# Patient Record
Sex: Male | Born: 1990 | Race: Black or African American | Hispanic: No | Marital: Single | Smoking: Current every day smoker
Health system: Southern US, Community
[De-identification: ages and names within clinical notes are randomized; demographics above are authoritative.]

---

## 2013-08-08 ENCOUNTER — Emergency Department: Payer: Self-pay | Admitting: Internal Medicine

## 2013-08-08 LAB — CBC
HCT: 49.6 % (ref 40.0–52.0)
HGB: 15.9 g/dL (ref 13.0–18.0)
MCH: 26.5 pg (ref 26.0–34.0)
MCHC: 32 g/dL (ref 32.0–36.0)
MCV: 83 fL (ref 80–100)
Platelet: 142 10*3/uL — ABNORMAL LOW (ref 150–440)
RBC: 6 10*6/uL — ABNORMAL HIGH (ref 4.40–5.90)
RDW: 14.3 % (ref 11.5–14.5)
WBC: 4.6 10*3/uL (ref 3.8–10.6)

## 2013-08-08 LAB — BASIC METABOLIC PANEL
ANION GAP: 4 — AB (ref 7–16)
BUN: 8 mg/dL (ref 7–18)
CALCIUM: 9.1 mg/dL (ref 8.5–10.1)
CHLORIDE: 106 mmol/L (ref 98–107)
CREATININE: 1.1 mg/dL (ref 0.60–1.30)
Co2: 29 mmol/L (ref 21–32)
EGFR (African American): >60
EGFR (Non-African Amer.): >60
Glucose: 83 mg/dL (ref 65–99)
OSMOLALITY: 275 (ref 275–301)
POTASSIUM: 3.8 mmol/L (ref 3.5–5.1)
SODIUM: 139 mmol/L (ref 136–145)

## 2013-08-08 LAB — TROPONIN I: Troponin-I: <0.02 ng/mL

## 2014-01-08 ENCOUNTER — Emergency Department: Payer: Self-pay | Admitting: Internal Medicine

## 2014-01-12 ENCOUNTER — Emergency Department: Payer: Self-pay | Admitting: Emergency Medicine

## 2015-12-18 IMAGING — CR DG CHEST 2V
1 series · 2 of 2 positions shown · non-contrast
Comparison: None.

CLINICAL DATA: Chest pain.  Shortness of breath.

EXAM:
CHEST  2 VIEW

[Series 1: w chest pa · 0.14mm/px · 2 of 2 slices shown]
[im 1/2]
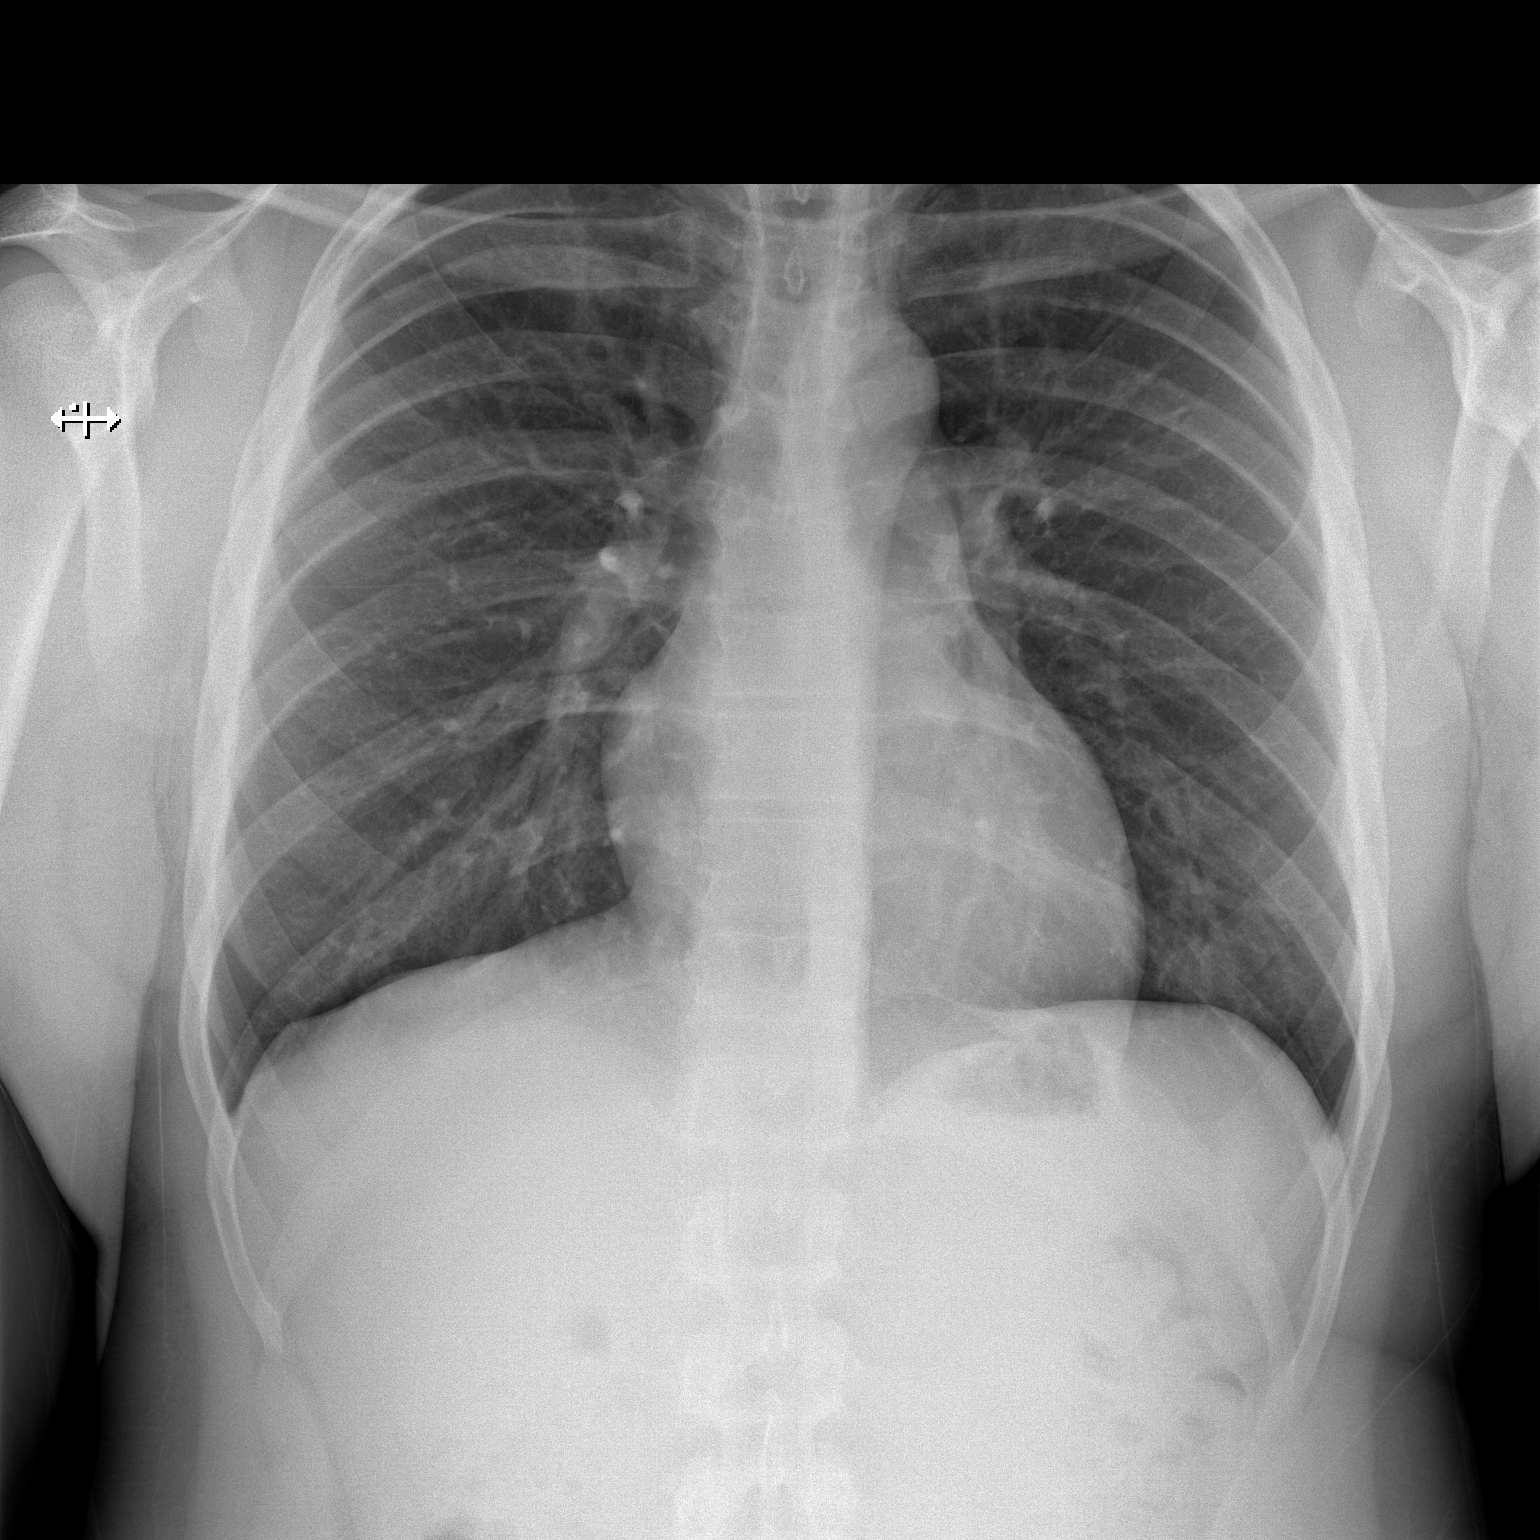
[im 2/2]
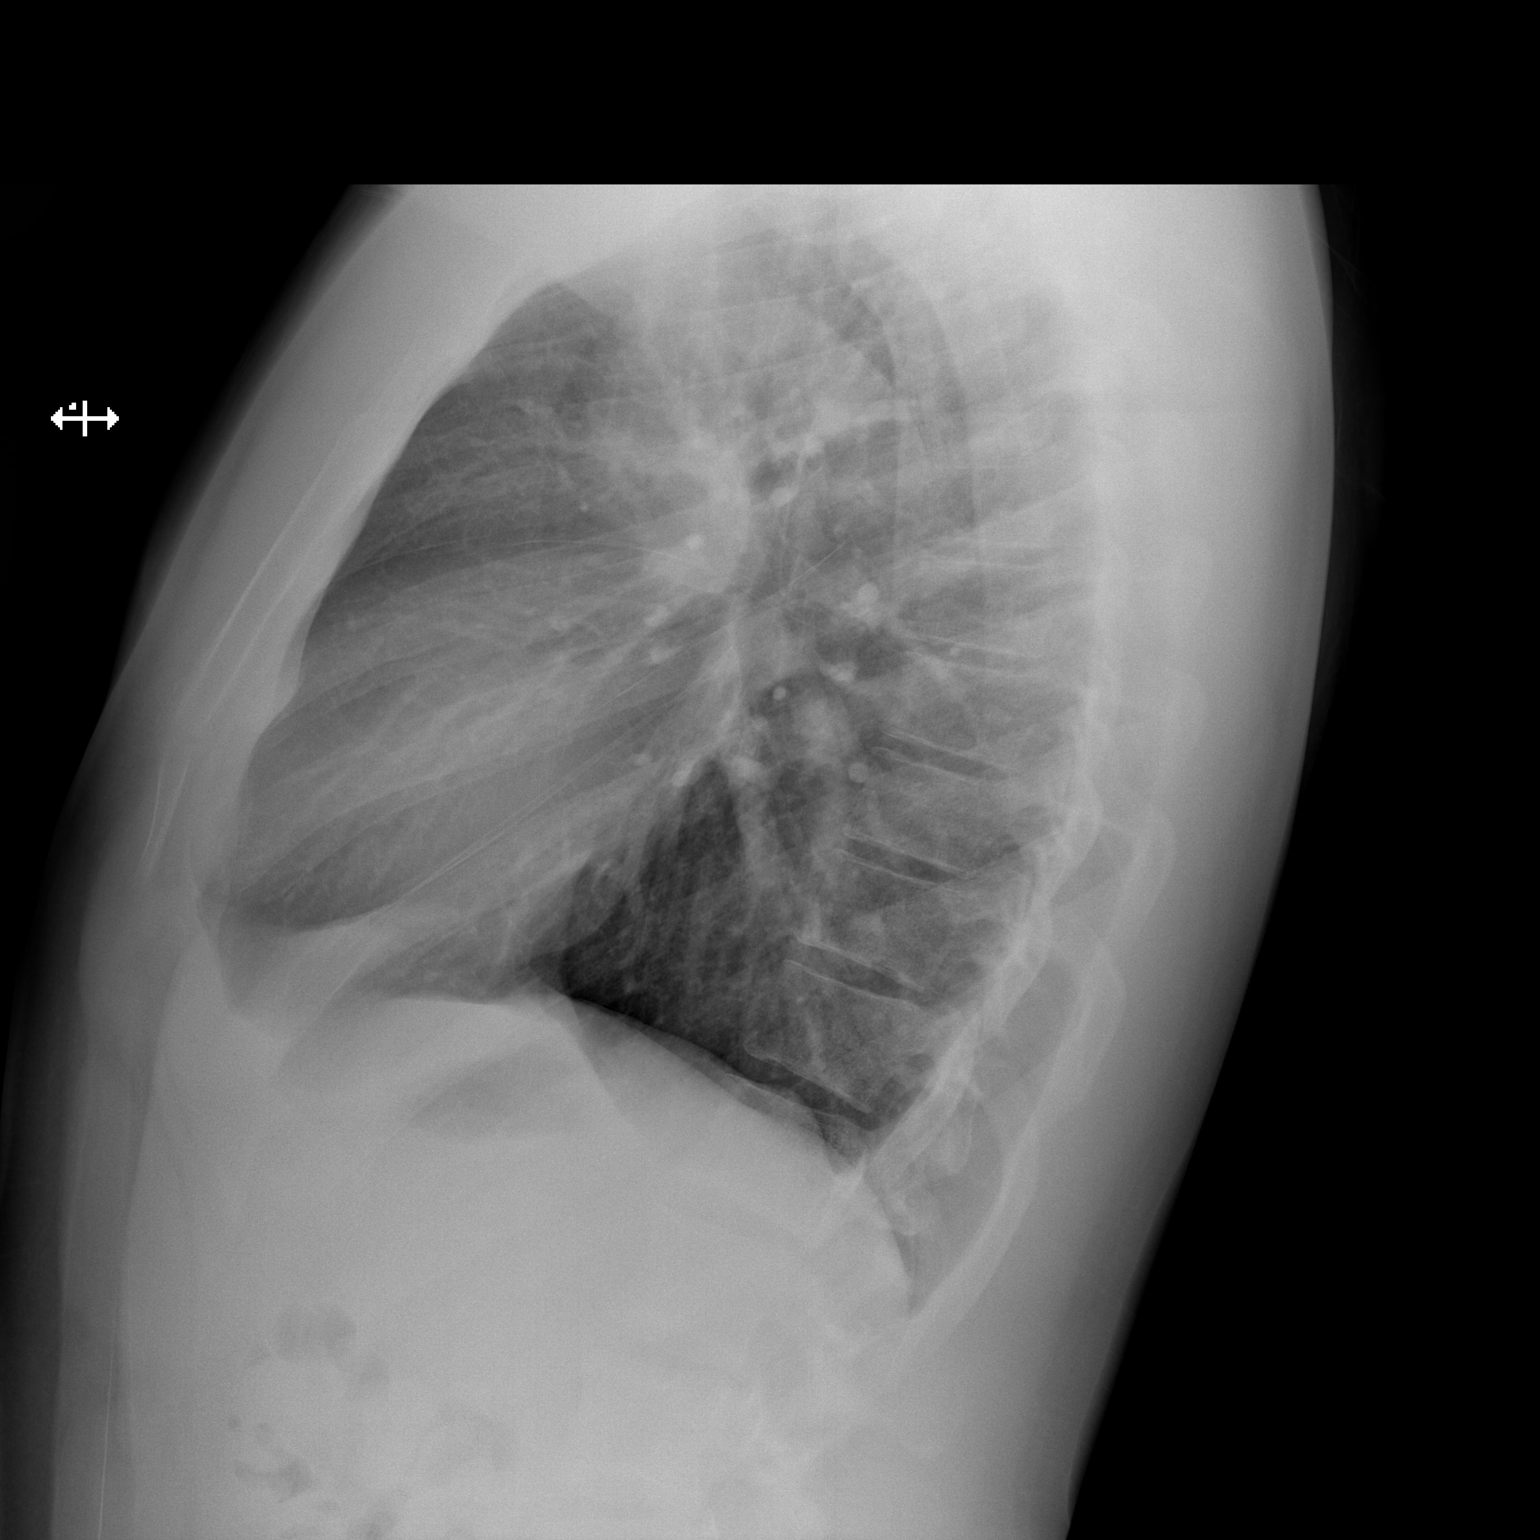

[2 of 2 positions shown; findings below may reference images not displayed]

FINDINGS: The heart size and mediastinal contours are within normal limits.
Both lungs are clear. The visualized skeletal structures are
unremarkable.
IMPRESSION: No active cardiopulmonary disease.

## 2019-04-23 ENCOUNTER — Other Ambulatory Visit: Payer: Self-pay

## 2019-04-23 ENCOUNTER — Ambulatory Visit: Payer: Self-pay | Admitting: Physician Assistant

## 2019-04-23 DIAGNOSIS — Z113 Encounter for screening for infections with a predominantly sexual mode of transmission: Secondary | ICD-10-CM

## 2019-04-23 LAB — GRAM STAIN

## 2019-04-23 NOTE — Progress Notes (Signed)
Gram stain reviewed; no tx per SO Hibah Odonnell, RN  

## 2019-04-25 ENCOUNTER — Encounter: Payer: Self-pay | Admitting: Physician Assistant

## 2019-04-25 NOTE — Progress Notes (Signed)
   [facility] Department STI clinic/screening visit  Subjective:  Lawrence Arnold [place] is a 29 y.o. male being seen today for an STI screening visit. The patient reports they do not have symptoms.    Patient has the following medical conditions:  There are no problems to display for this patient.    Chief Complaint  Patient presents with  . SEXUALLY TRANSMITTED DISEASE    screen    HPI  Patient reports that he is not having any symptoms but would like a screening today.     See flowsheet for further details and programmatic requirements.    The following portions of the patient's history were reviewed and updated as appropriate: allergies, current medications, past medical history, past social history, past surgical history and problem list.  Objective:  There were no vitals filed for this visit.  Physical Exam Constitutional:      General: He is not in acute distress.    Appearance: Normal appearance. He is normal weight.  HENT:     Head: Normocephalic and atraumatic.     Comments: No nits, lice or hair loss. No cervical, supraclavicular or axillary adenopathy.     Mouth/Throat:     Mouth: Mucous membranes are moist.     Pharynx: Oropharynx is clear. No oropharyngeal exudate or posterior oropharyngeal erythema.  Eyes:     Conjunctiva/sclera: Conjunctivae normal.  Abdominal:     Palpations: Abdomen is soft. There is no mass.     Tenderness: There is no abdominal tenderness. There is no guarding or rebound.  Genitourinary:    Penis: Normal.      Testes: Normal.     Comments: Pubic area without nits, lice, edema, erythema, lesions and inguinal adenopathy. Penis circumcised, without rash, lesion, and discharge from meatus. Musculoskeletal:     Cervical back: Neck supple. No tenderness.  Skin:    General: Skin is warm and dry.     Findings: No bruising, erythema, lesion or rash.  Neurological:     Mental Status: He is alert and oriented to person, place,  and time.  Psychiatric:        Mood and Affect: Mood normal.        Thought Content: Thought content normal.        Judgment: Judgment normal.       Assessment and Plan:  Lawrence Arnold [place] is a 29 y.o. male presenting to the [facility] Department for STI screening  1. Screening for STD (sexually transmitted disease) Patient into clinic without symptoms. Rec condoms with all sex. Await test results.  Counseled that RN will call if needs to RTC for treatment once results are back. - Gram stain - Gonococcus culture - HIV [place] [facility] - Syphilis Serology, [place] [facility]     No follow-ups on file.  No future appointments.  Matt Holmes, PA

## 2019-04-27 LAB — GONOCOCCUS CULTURE

## 2020-01-08 ENCOUNTER — Other Ambulatory Visit: Payer: Self-pay

## 2020-01-08 ENCOUNTER — Emergency Department (HOSPITAL_COMMUNITY)
Admission: EM | Admit: 2020-01-08 | Discharge: 2020-01-08 | Disposition: A | Discharge status: Home / Self Care | Payer: Managed Care, Other (non HMO) | Attending: Emergency Medicine | Admitting: Emergency Medicine

## 2020-01-08 ENCOUNTER — Encounter (HOSPITAL_COMMUNITY): Payer: Self-pay | Admitting: *Deleted

## 2020-01-08 DIAGNOSIS — K029 Dental caries, unspecified: Secondary | ICD-10-CM | POA: Diagnosis not present

## 2020-01-08 DIAGNOSIS — F17210 Nicotine dependence, cigarettes, uncomplicated: Secondary | ICD-10-CM | POA: Diagnosis not present

## 2020-01-08 DIAGNOSIS — K047 Periapical abscess without sinus: Secondary | ICD-10-CM | POA: Insufficient documentation

## 2020-01-08 MED ORDER — ONDANSETRON 4 MG PO TBDP
4.0000 mg | ORAL_TABLET | Freq: Once | ORAL | Status: AC
  Administered 2020-01-08: 4 mg via ORAL
  Filled 2020-01-08: qty 1

## 2020-01-08 MED ORDER — CLINDAMYCIN HCL 150 MG PO CAPS: 300.0000 mg | ORAL_CAPSULE | Freq: Three times a day (TID) | ORAL | 0 refills | Status: AC

## 2020-01-08 MED ORDER — OXYCODONE-ACETAMINOPHEN 5-325 MG PO TABS
2.0000 | ORAL_TABLET | ORAL | 0 refills | Status: DC | PRN
Start: 2020-01-08 — End: 2020-01-09

## 2020-01-08 MED ORDER — OXYCODONE-ACETAMINOPHEN 5-325 MG PO TABS
2.0000 | ORAL_TABLET | Freq: Once | ORAL | Status: AC
  Administered 2020-01-08: 2 via ORAL
  Filled 2020-01-08: qty 2

## 2020-01-08 MED ORDER — CHLORHEXIDINE GLUCONATE 0.12 % MT SOLN: 15.0000 mL | Freq: Two times a day (BID) | OROMUCOSAL | 0 refills | Status: AC

## 2020-01-08 MED ORDER — CLINDAMYCIN HCL 150 MG PO CAPS
300.0000 mg | ORAL_CAPSULE | Freq: Once | ORAL | Status: AC
  Administered 2020-01-08: 300 mg via ORAL
  Filled 2020-01-08: qty 2

## 2020-01-08 NOTE — ED Triage Notes (Signed)
Dental pain left upper jaw

## 2020-01-08 NOTE — Discharge Instructions (Addendum)
Contact a health care provider if: °Your pain is worse and is not helped by medicine. °Get help right away if: °You have a fever or chills. °Your symptoms suddenly get worse. °You have a very bad headache. °You have problems breathing or swallowing. °You have trouble opening your mouth. °You have swelling in your neck or around your eye. °

## 2020-01-08 NOTE — ED Provider Notes (Addendum)
[facility] EMERGENCY DEPARTMENT Provider Note   CSN: [id number] Arrival date & time: 01/08/20  1653     History Chief Complaint  Patient presents with  . Dental Pain    Lawrence Arnold is a 29 y.o. male.  Who presents emergency department with a chief complaint of dental infection.  He has a broken tooth on the left upper side of his mouth.  He states that 3 days ago he began having pain, severe swelling, chills, no fever.  He has some pain with swallowing, he has noticed inflamed lymph nodes down the left side of his neck and has some mild difficulty opening his jaw.  He has never had anything like this.  He took Tylenol without relief of his pain.  Pain is worse with cold.  Thank you  HPI     History reviewed. No pertinent past medical history.  There are no problems to display for this patient.   History reviewed. No pertinent surgical history.     No family history on file.  Social History   Tobacco Use  . Smoking status: Current Every Day Smoker    Types: Cigarettes  . Smokeless tobacco: Never Used  Substance Use Topics  . Alcohol use: Yes    Comment: occasionally  . Drug use: Not Currently    Types: Marijuana    Home Medications Prior to Admission medications   Not on File    Allergies    Patient has no known allergies.  Review of Systems   Review of Systems  Constitutional: Positive for chills. Negative for fever.  HENT: Positive for dental problem, facial swelling, sinus pain and trouble swallowing. Negative for voice change.   Musculoskeletal: Negative for neck stiffness.    Physical Exam Updated Vital Signs BP (!) 138/99   Pulse (!) 103   Temp 98.1 F (36.7 C)   Resp 20   Ht 6\' 1"  (1.854 m)   Wt 108.9 kg   SpO2 100%   BMI 31.66 kg/m   Physical Exam Vitals and nursing note reviewed.  Constitutional:      General: He is not in acute distress.    Appearance: He is well-developed. He is not diaphoretic.  HENT:     Head: Normocephalic  and atraumatic.      Mouth/Throat:     Dentition: Gingival swelling, dental caries and dental abscesses present.     Comments: Second premolar on the left upper gumline is decayed down below the surface.  There is extensive erythema and swelling of the hard palate with fluctuance noted. Eyes:     General: No scleral icterus.    Conjunctiva/sclera: Conjunctivae normal.  Neck:     Comments: Tender anterior left cervical adenopathy which appears to be reactive to the infection Cardiovascular:     Rate and Rhythm: Normal rate and regular rhythm.     Heart sounds: Normal heart sounds.  Pulmonary:     Effort: Pulmonary effort is normal. No respiratory distress.     Breath sounds: Normal breath sounds.  Abdominal:     Palpations: Abdomen is soft.     Tenderness: There is no abdominal tenderness.  Musculoskeletal:     Cervical back: Normal range of motion and neck supple.  Skin:    General: Skin is warm and dry.  Neurological:     Mental Status: He is alert.  Psychiatric:        Behavior: Behavior normal.     ED Results / Procedures / Treatments  Labs (all labs ordered are listed, but only abnormal results are displayed) Labs Reviewed - No data to display  EKG None  Radiology No results found.  Procedures .Marland KitchenIncision and Drainage  Date/Time: 01/08/2020 9:11 PM Performed by: Arthor Captain, PA-C Authorized by: Arthor Captain, PA-C   Consent:    Consent obtained:  Verbal   Consent given by:  Patient   Risks discussed:  Bleeding, incomplete drainage and pain Location:    Type:  Abscess   Size:  5cm   Location:  Mouth   Mouth location:  Palate Pre-procedure details:    Skin preparation:  Antiseptic wash Anesthesia (see MAR for exact dosages):    Anesthesia method:  Local infiltration   Local anesthetic:  Bupivacaine 0.25% w/o epi Procedure type:    Complexity:  Simple Procedure details:    Incision types:  Cruciate   Incision depth:  Dermal   Scalpel blade:   11   Wound management:  Irrigated with saline   Drainage:  Bloody and purulent   Drainage amount:  Copious   Wound treatment:  Wound left open Post-procedure details:    Patient tolerance of procedure:  Tolerated well, no immediate complications   (including critical care time)  Medications Ordered in ED Medications - No data to display  ED Course  I have reviewed the triage vital signs and the nursing notes.  Pertinent labs & imaging results that were available during my care of the patient were reviewed by me and considered in my medical decision making (see chart for details).    MDM Rules/Calculators/A&P                           Patient with dental abscess of the roof of the mouth stemming from what appears to be periapical abscess due to a rotten tooth.  I was able to incise and drain the abscess with fluctuance of the hard palate.  He had copious purulent drainage.  Bleeding slowed significantly prior to discharge.  Patient was able to irrigate and spit.  Patient given pain medication, clindamycin by mouth here.  He will be discharged with pain medication, clindamycin and Peridex and close outpatient follow-up with dentist.  Discussed return precautions.  PDMP reviewed during this encounter.   Final Clinical Impression(s) / ED Diagnoses Final diagnoses:  None    Rx / DC Orders ED Discharge Orders    None       Arthor Captain, PA-C 01/08/20 2113    Arthor Captain, PA-C 01/08/20 2114    Bethann Berkshire, MD 01/09/20 [phone]

## 2020-01-09 ENCOUNTER — Telehealth (HOSPITAL_COMMUNITY): Payer: Self-pay | Admitting: Emergency Medicine

## 2020-01-09 MED ORDER — OXYCODONE-ACETAMINOPHEN 5-325 MG PO TABS
1.0000 | ORAL_TABLET | Freq: Three times a day (TID) | ORAL | 0 refills | Status: AC | PRN
Start: 2020-01-09 — End: ?

## 2020-01-09 NOTE — Telephone Encounter (Signed)
Patient's pain med's did not go through. Percocet ordered and sent via e-script.

## 2020-01-10 MED FILL — Oxycodone w/ Acetaminophen Tab 5-325 MG: ORAL | Qty: 6 | Status: AC
